# Patient Record
Sex: Male | Born: 1955 | Race: White | Hispanic: No | Marital: Married | Smoking: Former smoker
Health system: Southern US, Community
[De-identification: ages and names within clinical notes are randomized; demographics above are authoritative.]

## PROBLEM LIST (undated history)

## (undated) DIAGNOSIS — E079 Disorder of thyroid, unspecified: Secondary | ICD-10-CM

## (undated) DIAGNOSIS — K219 Gastro-esophageal reflux disease without esophagitis: Secondary | ICD-10-CM

## (undated) DIAGNOSIS — R0989 Other specified symptoms and signs involving the circulatory and respiratory systems: Secondary | ICD-10-CM

## (undated) DIAGNOSIS — I6529 Occlusion and stenosis of unspecified carotid artery: Secondary | ICD-10-CM

## (undated) DIAGNOSIS — K621 Rectal polyp: Secondary | ICD-10-CM

## (undated) HISTORY — DX: Gastro-esophageal reflux disease without esophagitis: K21.9

## (undated) HISTORY — DX: Disorder of thyroid, unspecified: E07.9

## (undated) HISTORY — PX: OTHER SURGICAL HISTORY: SHX169

## (undated) HISTORY — DX: Other specified symptoms and signs involving the circulatory and respiratory systems: R09.89

## (undated) HISTORY — DX: Rectal polyp: K62.1

## (undated) HISTORY — DX: Occlusion and stenosis of unspecified carotid artery: I65.29

## (undated) HISTORY — PX: TRANSANAL EXCISION OF RECTAL MASS: SHX6134

---

## 2015-02-03 HISTORY — PX: NECK SURGERY: SHX720

## 2016-02-03 HISTORY — PX: SHOULDER SURGERY: SHX246

## 2017-10-07 DIAGNOSIS — E039 Hypothyroidism, unspecified: Secondary | ICD-10-CM | POA: Diagnosis not present

## 2017-10-07 DIAGNOSIS — Z1212 Encounter for screening for malignant neoplasm of rectum: Secondary | ICD-10-CM | POA: Diagnosis not present

## 2017-10-07 DIAGNOSIS — Z0000 Encounter for general adult medical examination without abnormal findings: Secondary | ICD-10-CM | POA: Diagnosis not present

## 2017-10-07 DIAGNOSIS — Z6826 Body mass index (BMI) 26.0-26.9, adult: Secondary | ICD-10-CM | POA: Diagnosis not present

## 2017-11-19 DIAGNOSIS — E039 Hypothyroidism, unspecified: Secondary | ICD-10-CM | POA: Diagnosis not present

## 2018-02-07 DIAGNOSIS — E039 Hypothyroidism, unspecified: Secondary | ICD-10-CM | POA: Diagnosis not present

## 2018-04-19 DIAGNOSIS — L820 Inflamed seborrheic keratosis: Secondary | ICD-10-CM | POA: Diagnosis not present

## 2018-04-19 DIAGNOSIS — L570 Actinic keratosis: Secondary | ICD-10-CM | POA: Diagnosis not present

## 2018-04-19 DIAGNOSIS — D485 Neoplasm of uncertain behavior of skin: Secondary | ICD-10-CM | POA: Diagnosis not present

## 2018-04-19 DIAGNOSIS — Z23 Encounter for immunization: Secondary | ICD-10-CM | POA: Diagnosis not present

## 2018-04-19 DIAGNOSIS — L821 Other seborrheic keratosis: Secondary | ICD-10-CM | POA: Diagnosis not present

## 2018-07-21 DIAGNOSIS — L570 Actinic keratosis: Secondary | ICD-10-CM | POA: Diagnosis not present

## 2018-07-21 DIAGNOSIS — D1801 Hemangioma of skin and subcutaneous tissue: Secondary | ICD-10-CM | POA: Diagnosis not present

## 2018-07-21 DIAGNOSIS — L821 Other seborrheic keratosis: Secondary | ICD-10-CM | POA: Diagnosis not present

## 2018-07-21 DIAGNOSIS — L814 Other melanin hyperpigmentation: Secondary | ICD-10-CM | POA: Diagnosis not present

## 2018-08-11 DIAGNOSIS — E039 Hypothyroidism, unspecified: Secondary | ICD-10-CM | POA: Diagnosis not present

## 2018-08-11 DIAGNOSIS — Z6827 Body mass index (BMI) 27.0-27.9, adult: Secondary | ICD-10-CM | POA: Diagnosis not present

## 2018-08-11 DIAGNOSIS — R079 Chest pain, unspecified: Secondary | ICD-10-CM | POA: Diagnosis not present

## 2018-10-20 DIAGNOSIS — Z1331 Encounter for screening for depression: Secondary | ICD-10-CM | POA: Diagnosis not present

## 2018-10-20 DIAGNOSIS — Z6826 Body mass index (BMI) 26.0-26.9, adult: Secondary | ICD-10-CM | POA: Diagnosis not present

## 2018-10-20 DIAGNOSIS — Z0000 Encounter for general adult medical examination without abnormal findings: Secondary | ICD-10-CM | POA: Diagnosis not present

## 2018-10-28 ENCOUNTER — Encounter: Payer: Self-pay | Admitting: *Deleted

## 2018-10-28 DIAGNOSIS — R0989 Other specified symptoms and signs involving the circulatory and respiratory systems: Secondary | ICD-10-CM | POA: Insufficient documentation

## 2018-10-28 DIAGNOSIS — K219 Gastro-esophageal reflux disease without esophagitis: Secondary | ICD-10-CM

## 2018-10-28 DIAGNOSIS — E039 Hypothyroidism, unspecified: Secondary | ICD-10-CM

## 2018-11-01 ENCOUNTER — Other Ambulatory Visit: Payer: Self-pay

## 2018-11-01 ENCOUNTER — Ambulatory Visit (INDEPENDENT_AMBULATORY_CARE_PROVIDER_SITE_OTHER): Payer: BC Managed Care – PPO | Admitting: Cardiology

## 2018-11-01 ENCOUNTER — Encounter: Payer: Self-pay | Admitting: Cardiology

## 2018-11-01 VITALS — BP 120/80 | HR 52 | Ht 70.0 in | Wt 184.0 lb

## 2018-11-01 DIAGNOSIS — R0989 Other specified symptoms and signs involving the circulatory and respiratory systems: Secondary | ICD-10-CM

## 2018-11-01 DIAGNOSIS — R0789 Other chest pain: Secondary | ICD-10-CM | POA: Diagnosis not present

## 2018-11-01 DIAGNOSIS — R079 Chest pain, unspecified: Secondary | ICD-10-CM | POA: Diagnosis not present

## 2018-11-01 DIAGNOSIS — R001 Bradycardia, unspecified: Secondary | ICD-10-CM | POA: Diagnosis not present

## 2018-11-01 DIAGNOSIS — Z13220 Encounter for screening for lipoid disorders: Secondary | ICD-10-CM

## 2018-11-01 NOTE — Progress Notes (Signed)
Cardiology Office Note:    Date:  11/01/2018   ID:  Alroy Dust, DOB 1955/07/30, MRN [id number]  PCP:  Lowella Dandy, NP  Cardiologist:  Berniece Salines, DO  Electrophysiologist:  None   Referring MD: Lowella Dandy, NP   The patient was referred by his primary care provider for chest pain.  History of Present Illness:    Jason Schmidt is a 62 y.o. male with a hx of hypothyroidism, carotid bruits, obesity presents to be evaluated for chest pain.The patient describes the pain as a substernal burning sensation with pressure like feeling sometimes. He denies any radiation to this pain. He notes that the pain started months ago. He states that it is intermittent, last about a few minutes (10-82mns). He admits to associated fatigue but denies palpitations, shortness of breath, nausea or vomiting.    Past Medical History:  Diagnosis Date   Carotid artery occlusion    Carotid bruit    GERD (gastroesophageal reflux disease)    Rectal polyp    Thyroid disease     Past Surgical History:  Procedure Laterality Date   i&d abcess and hemorrhoidectomy     NECK SURGERY  2017   Metal in neck at work   SHOULDER SURGERY Left 2018   Nerve damage   TRANSANAL EXCISION OF RECTAL MASS      Current Medications: Current Meds  Medication Sig   levothyroxine (SYNTHROID) 100 MCG tablet Take 100 mcg by mouth daily before breakfast.     Allergies:   Codeine   Social History   Socioeconomic History   Marital status: Married    Spouse name: Not on file   Number of children: Not on file   Years of education: Not on file   Highest education level: Not on file  Occupational History   Not on file  Social Needs   Financial resource strain: Not on file   Food insecurity    Worry: Not on file    Inability: Not on file   Transportation needs    Medical: Not on file    Non-medical: Not on file  Tobacco Use   Smoking status: Former Smoker    Quit date: 2007    Years since  quitting: 13.7   Smokeless tobacco: Never Used  Substance and Sexual Activity   Alcohol use: Not Currently   Drug use: Never   Sexual activity: Not on file  Lifestyle   Physical activity    Days per week: Not on file    Minutes per session: Not on file   Stress: Not on file  Relationships   Social connections    Talks on phone: Not on file    Gets together: Not on file    Attends religious service: Not on file    Active member of club or organization: Not on file    Attends meetings of clubs or organizations: Not on file    Relationship status: Not on file  Other Topics Concern   Not on file  Social History Narrative   Not on file     Family History: The patient's family history includes Heart attack in his mother; Heart disease in his brother and father; Hyperlipidemia in his mother; Hypertension in his mother; Pancreatic cancer in his brother.  Father first MI was in his late 528s  Brother had his first MI in his early 666s ROS:   Review of Systems  Constitution: Negative for decreased appetite, fever and weight gain.  HENT: Negative for congestion, ear discharge, hoarse voice and sore throat.   Eyes: Negative for discharge, redness, vision loss in right eye and visual halos.  Cardiovascular: Reports chest pain. Negative for chest pain, dyspnea on exertion, leg swelling, orthopnea and palpitations.  Respiratory: Negative for cough, hemoptysis, shortness of breath and snoring.   Endocrine: Negative for heat intolerance and polyphagia.  Hematologic/Lymphatic: Negative for bleeding problem. Does not bruise/bleed easily.  Skin: Negative for flushing, nail changes, rash and suspicious lesions.  Musculoskeletal: Negative for arthritis, joint pain, muscle cramps, myalgias, neck pain and stiffness.  Gastrointestinal: Negative for abdominal pain, bowel incontinence, diarrhea and excessive appetite.  Genitourinary: Negative for decreased libido, genital sores and incomplete  emptying.  Neurological: Negative for brief paralysis, focal weakness, headaches and loss of balance.  Psychiatric/Behavioral: Negative for altered mental status, depression and suicidal ideas.  Allergic/Immunologic: Negative for HIV exposure and persistent infections.    EKGs/Labs/Other Studies Reviewed:    The following studies were reviewed today:   EKG:  The ekg ordered today demonstrates sinus bradycardia, heart rate 54 bpm  Records from pcp office reports a normal TTE in 2012  Recent Labs: Labs from July 2020 reports glucose 100, BUN 22, creatinine 1.0, sodium 138, potassium 4.5, chloride 101, bicarb 25, total bili 0.5, AST 18, ALT 16, alk phos 68, hemoglobin 15.1, hematocrit 44.1, platelets 191  Recent Lipid Panel Lipid profile unspecified date Total cholesterol 200, triglyceride 110, HDL 43, VLDL 20, LDL 137   Physical Exam:    VS:  BP 120/80 (BP Location: Left Arm, Patient Position: Sitting, Cuff Size: Normal)    Pulse (!) 52    Ht '5\' 10"'  (1.778 m)    Wt 184 lb (83.5 kg)    SpO2 96%    BMI 26.40 kg/m     Wt Readings from Last 3 Encounters:  11/01/18 184 lb (83.5 kg)     GEN: Well nourished, well developed in no acute distress HEENT: Normal NECK: No JVD; No carotid bruits LYMPHATICS: No lymphadenopathy CARDIAC: S1S2 noted,RRR, no murmurs, rubs, gallops RESPIRATORY:  Clear to auscultation without rales, wheezing or rhonchi  ABDOMEN: Soft, non-tender, non-distended, +bowel sounds, no guarding. EXTREMITIES: No edema, No cyanosis, no clubbing MUSCULOSKELETAL:  No edema; No deformity  SKIN: Warm and dry, skin discoloration  NEUROLOGIC:  Alert and oriented x 3, non-focal PSYCHIATRIC:  Normal affect, good insight  ASSESSMENT:    1. Carotid bruit, unspecified laterality   2. Chest pain of uncertain etiology   3. Sinus bradycardia   4. Chest pain, unspecified type   5. Screening cholesterol level    PLAN:    1.  I am concerned of his chest pain, although atypical  patient does have intermediate risk  for coronary artery disease.  Given this we will pursue up ischemic evaluation.  A pharmacologic nuclear stress test is appropriate at this time.  Patient was educated on the testing he has in agreement with getting the testing done.  2.  There is evidence of sinus bradycardia he is currently not on any AV nodal blockers.  Given his history of hypothyroidism we will get TSH today.  As well as a echocardiogram will be performed to assess RV/LV function as well as any structural abnormalities.  3.  Lipid profile will be performed today as well.  The patient is in agreement with the above plan. The patient left the office in stable condition.  The patient will follow up in 3 months.   Medication Adjustments/Labs and  Tests Ordered: Current medicines are reviewed at length with the patient today.  Concerns regarding medicines are outlined above.  Orders Placed This Encounter  Procedures   Basic Metabolic Panel (BMET)   Magnesium   TSH   Lipid Profile   MYOCARDIAL PERFUSION IMAGING   EKG 12-Lead   ECHOCARDIOGRAM COMPLETE   No orders of the defined types were placed in this encounter.   Patient Instructions  Medication Instructions:  Your physician recommends that you continue on your current medications as directed. Please refer to the Current Medication list given to you today.  If you need a refill on your cardiac medications before your next appointment, please call your pharmacy.   Lab work: Your physician recommends that you have a BMP, mag, TSH and lipid drawn today.  If you have labs (blood work) drawn today and your tests are completely normal, you will receive your results only by:  [facility] (if you have MyChart) OR  A paper copy in the mail If you have any lab test that is abnormal or we need to change your treatment, we will call you to review the results.  Testing/Procedures: You had an EKG performed today.  Your  physician has requested that you have a lexiscan myoview. For further information please visit [email or web address]. Please follow instruction sheet, as given.  Your physician has requested that you have an echocardiogram. Echocardiography is a painless test that uses sound waves to create images of your heart. It provides your doctor with information about the size and shape of your heart and how well your hearts chambers and valves are working. This procedure takes approximately one hour. There are no restrictions for this procedure.   Follow-Up: At [facility], you and your health needs are our priority.  As part of our continuing mission to provide you with exceptional heart care, we have created designated Provider Care Teams.  These Care Teams include your primary Cardiologist (physician) and Advanced Practice Providers (APPs -  Physician Assistants and Nurse Practitioners) who all work together to provide you with the care you need, when you need it.  You will need a follow up appointment in 3 months.    Kardi Zaina Jenkin, DO  Any Other Special Instructions Will Be Listed Below  Regadenoson injection What is this medicine? REGADENOSON is used to test the heart for coronary artery disease. It is used in patients who can not exercise for their stress test. This medicine may be used for other purposes; ask your health care provider or pharmacist if you have questions. COMMON BRAND NAME(S): Lexiscan What should I tell my health care provider before I take this medicine? They need to know if you have any of these conditions:  heart problems  lung or breathing disease, like asthma or COPD  an unusual or allergic reaction to regadenoson, other medicines, foods, dyes, or preservatives  pregnant or trying to get pregnant  breast-feeding How should I use this medicine? This medicine is for injection into a vein. It is given by a health care professional in a hospital or clinic setting. Talk  to your pediatrician regarding the use of this medicine in children. Special care may be needed. Overdosage: If you think you have taken too much of this medicine contact a poison control center or emergency room at once. NOTE: This medicine is only for you. Do not share this medicine with others. What if I miss a dose? This does not apply. What may interact with this medicine?  caffeine  dipyridamole  guarana  theophylline This list may not describe all possible interactions. Give your health care provider a list of all the medicines, herbs, non-prescription drugs, or dietary supplements you use. Also tell them if you smoke, drink alcohol, or use illegal drugs. Some items may interact with your medicine. What should I watch for while using this medicine? Your condition will be monitored carefully while you are receiving this medicine. Do not take medicines, foods, or drinks with caffeine (like coffee, tea, or colas) for at least 12 hours before your test. If you do not know if something contains caffeine, ask your health care professional. What side effects may I notice from receiving this medicine? Side effects that you should report to your doctor or health care professional as soon as possible:  allergic reactions like skin rash, itching or hives, swelling of the face, lips, or tongue  breathing problems  chest pain, tightness or palpitations  severe headache Side effects that usually do not require medical attention (report to your doctor or health care professional if they continue or are bothersome):  flushing  headache  irritation or pain at site where injected  nausea, vomiting This list may not describe all possible side effects. Call your doctor for medical advice about side effects. You may report side effects to FDA at 1-800-FDA-1088. Where should I keep my medicine? This drug is given in a hospital or clinic and will not be stored at home. NOTE: This sheet is a  summary. It may not cover all possible information. If you have questions about this medicine, talk to your doctor, pharmacist, or health care provider.  2020 Elsevier/Gold Standard (2007-09-19 15:08:13)   Cardiac Nuclear Scan A cardiac nuclear scan is a test that is done to check the flow of blood to your heart. It is done when you are resting and when you are exercising. The test looks for problems such as:  Not enough blood reaching a portion of the heart.  The heart muscle not working as it should. You may need this test if:  You have heart disease.  You have had lab results that are not normal.  You have had heart surgery or a balloon procedure to open up blocked arteries (angioplasty).  You have chest pain.  You have shortness of breath. In this test, a special dye (tracer) is put into your bloodstream. The tracer will travel to your heart. A camera will then take pictures of your heart to see how the tracer moves through your heart. This test is usually done at a hospital and takes 2-4 hours. Tell a doctor about:  Any allergies you have.  All medicines you are taking, including vitamins, herbs, eye drops, creams, and over-the-counter medicines.  Any problems you or family members have had with anesthetic medicines.  Any blood disorders you have.  Any surgeries you have had.  Any medical conditions you have.  Whether you are pregnant or may be pregnant. What are the risks? Generally, this is a safe test. However, problems may occur, such as:  Serious chest pain and heart attack. This is only a risk if the stress portion of the test is done.  Rapid heartbeat.  A feeling of warmth in your chest. This feeling usually does not last long.  Allergic reaction to the tracer. What happens before the test?  Ask your doctor about changing or stopping your normal medicines. This is important.  Follow instructions from your doctor about what you cannot  eat or  drink.  Remove your jewelry on the day of the test. What happens during the test?  An IV tube will be inserted into one of your veins.  Your doctor will give you a small amount of tracer through the IV tube.  You will wait for 20-40 minutes while the tracer moves through your bloodstream.  Your heart will be monitored with an electrocardiogram (ECG).  You will lie down on an exam table.  Pictures of your heart will be taken for about 15-20 minutes.  You may also have a stress test. For this test, one of these things may be done: ? You will be asked to exercise on a treadmill or a stationary bike. ? You will be given medicines that will make your heart work harder. This is done if you are unable to exercise.  When blood flow to your heart has peaked, a tracer will again be given through the IV tube.  After 20-40 minutes, you will get back on the exam table. More pictures will be taken of your heart.  Depending on the tracer that is used, more pictures may need to be taken 3-4 hours later.  Your IV tube will be removed when the test is over. The test may vary among doctors and hospitals. What happens after the test?  Ask your doctor: ? Whether you can return to your normal schedule, including diet, activities, and medicines. ? Whether you should drink more fluids. This will help to remove the tracer from your body. Drink enough fluid to keep your pee (urine) pale yellow.  Ask your doctor, or the department that is doing the test: ? When will my results be ready? ? How will I get my results? Summary  A cardiac nuclear scan is a test that is done to check the flow of blood to your heart.  Tell your doctor whether you are pregnant or may be pregnant.  Before the test, ask your doctor about changing or stopping your normal medicines. This is important.  Ask your doctor whether you can return to your normal activities. You may be asked to drink more fluids. This information is  not intended to replace advice given to you by your health care provider. Make sure you discuss any questions you have with your health care provider. Document Released: 07/05/2017 Document Revised: 05/11/2018 Document Reviewed: 07/05/2017 Elsevier Patient Education  [employer].  Echocardiogram An echocardiogram is a procedure that uses painless sound waves (ultrasound) to produce an image of the heart. Images from an echocardiogram can provide important information about:  Signs of coronary artery disease (CAD).  Aneurysm detection. An aneurysm is a weak or damaged part of an artery wall that bulges out from the normal force of blood pumping through the body.  Heart size and shape. Changes in the size or shape of the heart can be associated with certain conditions, including heart failure, aneurysm, and CAD.  Heart muscle function.  Heart valve function.  Signs of a past heart attack.  Fluid buildup around the heart.  Thickening of the heart muscle.  A tumor or infectious growth around the heart valves. Tell a health care provider about:  Any allergies you have.  All medicines you are taking, including vitamins, herbs, eye drops, creams, and over-the-counter medicines.  Any blood disorders you have.  Any surgeries you have had.  Any medical conditions you have.  Whether you are pregnant or may be pregnant. What are the risks? Generally, this  is a safe procedure. However, problems may occur, including:  Allergic reaction to dye (contrast) that may be used during the procedure. What happens before the procedure? No specific preparation is needed. You may eat and drink normally. What happens during the procedure?   An IV tube may be inserted into one of your veins.  You may receive contrast through this tube. A contrast is an injection that improves the quality of the pictures from your heart.  A gel will be applied to your chest.  A wand-like tool  (transducer) will be moved over your chest. The gel will help to transmit the sound waves from the transducer.  The sound waves will harmlessly bounce off of your heart to allow the heart images to be captured in real-time motion. The images will be recorded on a computer. The procedure may vary among health care providers and hospitals. What happens after the procedure?  You may return to your normal, everyday life, including diet, activities, and medicines, unless your health care provider tells you not to do that. Summary  An echocardiogram is a procedure that uses painless sound waves (ultrasound) to produce an image of the heart.  Images from an echocardiogram can provide important information about the size and shape of your heart, heart muscle function, heart valve function, and fluid buildup around your heart.  You do not need to do anything to prepare before this procedure. You may eat and drink normally.  After the echocardiogram is completed, you may return to your normal, everyday life, unless your health care provider tells you not to do that. This information is not intended to replace advice given to you by your health care provider. Make sure you discuss any questions you have with your health care provider. Document Released: 01/17/2000 Document Revised: 05/12/2018 Document Reviewed: 02/22/2016 Elsevier Patient Education  2020 [employer].     Adopting a Healthy Lifestyle.  Know what a healthy weight is for you (roughly BMI <25) and aim to maintain this   Aim for 7+ servings of fruits and vegetables daily   65-80+ fluid ounces of water or unsweet tea for healthy kidneys   Limit to max 1 drink of alcohol per day; avoid smoking/tobacco   Limit animal fats in diet for cholesterol and heart health - choose grass fed whenever available   Avoid highly processed foods, and foods high in saturated/trans fats   Aim for low stress - take time to unwind and care for your  mental health   Aim for 150 min of moderate intensity exercise weekly for heart health, and weights twice weekly for bone health   Aim for 7-9 hours of sleep daily   When it comes to diets, agreement about the perfect plan isnt easy to find, even among the experts. Experts at the [employer] developed an idea known as the Healthy Eating Plate. Just imagine a plate divided into logical, healthy portions.   The emphasis is on diet quality:   Load up on vegetables and fruits - one-half of your plate: Aim for color and variety, and remember that potatoes dont count.   Go for whole grains - one-quarter of your plate: Whole wheat, barley, wheat berries, quinoa, oats, brown rice, and foods made with them. If you want pasta, go with whole wheat pasta.   Protein power - one-quarter of your plate: Fish, chicken, beans, and nuts are all healthy, versatile protein sources. Limit red meat.   The diet, however,  does go beyond the plate, offering a few other suggestions.   Use healthy plant oils, such as olive, canola, soy, corn, sunflower and peanut. Check the labels, and avoid partially hydrogenated oil, which have unhealthy trans fats.   If youre thirsty, drink water. Coffee and tea are good in moderation, but skip sugary drinks and limit milk and dairy products to one or two daily servings.   The type of carbohydrate in the diet is more important than the amount. Some sources of carbohydrates, such as vegetables, fruits, whole grains, and beans-are healthier than others.   Finally, stay active  Signed, Berniece Salines, DO  11/01/2018 8:53 AM    [place]

## 2018-11-01 NOTE — Patient Instructions (Addendum)
Medication Instructions:  Your physician recommends that you continue on your current medications as directed. Please refer to the Current Medication list given to you today.  If you need a refill on your cardiac medications before your next appointment, please call your pharmacy.   Lab work: Your physician recommends that you have a BMP, mag, TSH and lipid drawn today.  If you have labs (blood work) drawn today and your tests are completely normal, you will receive your results only by: Marland Kitchen MyChart Message (if you have MyChart) OR . A paper copy in the mail If you have any lab test that is abnormal or we need to change your treatment, we will call you to review the results.  Testing/Procedures: You had an EKG performed today.  Your physician has requested that you have a lexiscan myoview. For further information please visit [email or web address] Please follow instruction sheet, as given.  Your physician has requested that you have an echocardiogram. Echocardiography is a painless test that uses sound waves to create images of your heart. It provides your doctor with information about the size and shape of your heart and how well your heart's chambers and valves are working. This procedure takes approximately one hour. There are no restrictions for this procedure.   Follow-Up: At [facility], you and your health needs are our priority.  As part of our continuing mission to provide you with exceptional heart care, we have created designated Provider Care Teams.  These Care Teams include your primary Cardiologist (physician) and Advanced Practice Providers (APPs -  Physician Assistants and Nurse Practitioners) who all work together to provide you with the care you need, when you need it. . You will need a follow up appointment in 3 months.   Jolyn Lent Tobb, DO  Any Other Special Instructions Will Be Listed Below  Regadenoson injection What is this medicine? REGADENOSON is used to test the  heart for coronary artery disease. It is used in patients who can not exercise for their stress test. This medicine may be used for other purposes; ask your health care provider or pharmacist if you have questions. COMMON BRAND NAME(S): Lexiscan What should I tell my health care provider before I take this medicine? They need to know if you have any of these conditions:  heart problems  lung or breathing disease, like asthma or COPD  an unusual or allergic reaction to regadenoson, other medicines, foods, dyes, or preservatives  pregnant or trying to get pregnant  breast-feeding How should I use this medicine? This medicine is for injection into a vein. It is given by a health care professional in a hospital or clinic setting. Talk to your pediatrician regarding the use of this medicine in children. Special care may be needed. Overdosage: If you think you have taken too much of this medicine contact a poison control center or emergency room at once. NOTE: This medicine is only for you. Do not share this medicine with others. What if I miss a dose? This does not apply. What may interact with this medicine?  caffeine  dipyridamole  guarana  theophylline This list may not describe all possible interactions. Give your health care provider a list of all the medicines, herbs, non-prescription drugs, or dietary supplements you use. Also tell them if you smoke, drink alcohol, or use illegal drugs. Some items may interact with your medicine. What should I watch for while using this medicine? Your condition will be monitored carefully while you are receiving this medicine.  Do not take medicines, foods, or drinks with caffeine (like coffee, tea, or colas) for at least 12 hours before your test. If you do not know if something contains caffeine, ask your health care professional. What side effects may I notice from receiving this medicine? Side effects that you should report to your doctor or  health care professional as soon as possible:  allergic reactions like skin rash, itching or hives, swelling of the face, lips, or tongue  breathing problems  chest pain, tightness or palpitations  severe headache Side effects that usually do not require medical attention (report to your doctor or health care professional if they continue or are bothersome):  flushing  headache  irritation or pain at site where injected  nausea, vomiting This list may not describe all possible side effects. Call your doctor for medical advice about side effects. You may report side effects to FDA at 1-800-FDA-1088. Where should I keep my medicine? This drug is given in a hospital or clinic and will not be stored at home. NOTE: This sheet is a summary. It may not cover all possible information. If you have questions about this medicine, talk to your doctor, pharmacist, or health care provider.  2020 Elsevier/Gold Standard (2007-09-19 15:08:13)   Cardiac Nuclear Scan A cardiac nuclear scan is a test that is done to check the flow of blood to your heart. It is done when you are resting and when you are exercising. The test looks for problems such as:  Not enough blood reaching a portion of the heart.  The heart muscle not working as it should. You may need this test if:  You have heart disease.  You have had lab results that are not normal.  You have had heart surgery or a balloon procedure to open up blocked arteries (angioplasty).  You have chest pain.  You have shortness of breath. In this test, a special dye (tracer) is put into your bloodstream. The tracer will travel to your heart. A camera will then take pictures of your heart to see how the tracer moves through your heart. This test is usually done at a hospital and takes 2-4 hours. Tell a doctor about:  Any allergies you have.  All medicines you are taking, including vitamins, herbs, eye drops, creams, and over-the-counter  medicines.  Any problems you or family members have had with anesthetic medicines.  Any blood disorders you have.  Any surgeries you have had.  Any medical conditions you have.  Whether you are pregnant or may be pregnant. What are the risks? Generally, this is a safe test. However, problems may occur, such as:  Serious chest pain and heart attack. This is only a risk if the stress portion of the test is done.  Rapid heartbeat.  A feeling of warmth in your chest. This feeling usually does not last long.  Allergic reaction to the tracer. What happens before the test?  Ask your doctor about changing or stopping your normal medicines. This is important.  Follow instructions from your doctor about what you cannot eat or drink.  Remove your jewelry on the day of the test. What happens during the test?  An IV tube will be inserted into one of your veins.  Your doctor will give you a small amount of tracer through the IV tube.  You will wait for 20-40 minutes while the tracer moves through your bloodstream.  Your heart will be monitored with an electrocardiogram (ECG).  You will lie  down on an exam table.  Pictures of your heart will be taken for about 15-20 minutes.  You may also have a stress test. For this test, one of these things may be done: ? You will be asked to exercise on a treadmill or a stationary bike. ? You will be given medicines that will make your heart work harder. This is done if you are unable to exercise.  When blood flow to your heart has peaked, a tracer will again be given through the IV tube.  After 20-40 minutes, you will get back on the exam table. More pictures will be taken of your heart.  Depending on the tracer that is used, more pictures may need to be taken 3-4 hours later.  Your IV tube will be removed when the test is over. The test may vary among doctors and hospitals. What happens after the test?  Ask your doctor: ? Whether you  can return to your normal schedule, including diet, activities, and medicines. ? Whether you should drink more fluids. This will help to remove the tracer from your body. Drink enough fluid to keep your pee (urine) pale yellow.  Ask your doctor, or the department that is doing the test: ? When will my results be ready? ? How will I get my results? Summary  A cardiac nuclear scan is a test that is done to check the flow of blood to your heart.  Tell your doctor whether you are pregnant or may be pregnant.  Before the test, ask your doctor about changing or stopping your normal medicines. This is important.  Ask your doctor whether you can return to your normal activities. You may be asked to drink more fluids. This information is not intended to replace advice given to you by your health care provider. Make sure you discuss any questions you have with your health care provider. Document Released: 07/05/2017 Document Revised: 05/11/2018 Document Reviewed: 07/05/2017 Elsevier Patient Education  2020 [employer]  Echocardiogram An echocardiogram is a procedure that uses painless sound waves (ultrasound) to produce an image of the heart. Images from an echocardiogram can provide important information about:  Signs of coronary artery disease (CAD).  Aneurysm detection. An aneurysm is a weak or damaged part of an artery wall that bulges out from the normal force of blood pumping through the body.  Heart size and shape. Changes in the size or shape of the heart can be associated with certain conditions, including heart failure, aneurysm, and CAD.  Heart muscle function.  Heart valve function.  Signs of a past heart attack.  Fluid buildup around the heart.  Thickening of the heart muscle.  A tumor or infectious growth around the heart valves. Tell a health care provider about:  Any allergies you have.  All medicines you are taking, including vitamins, herbs, eye drops, creams,  and over-the-counter medicines.  Any blood disorders you have.  Any surgeries you have had.  Any medical conditions you have.  Whether you are pregnant or may be pregnant. What are the risks? Generally, this is a safe procedure. However, problems may occur, including:  Allergic reaction to dye (contrast) that may be used during the procedure. What happens before the procedure? No specific preparation is needed. You may eat and drink normally. What happens during the procedure?   An IV tube may be inserted into one of your veins.  You may receive contrast through this tube. A contrast is an injection that improves the quality  of the pictures from your heart.  A gel will be applied to your chest.  A wand-like tool (transducer) will be moved over your chest. The gel will help to transmit the sound waves from the transducer.  The sound waves will harmlessly bounce off of your heart to allow the heart images to be captured in real-time motion. The images will be recorded on a computer. The procedure may vary among health care providers and hospitals. What happens after the procedure?  You may return to your normal, everyday life, including diet, activities, and medicines, unless your health care provider tells you not to do that. Summary  An echocardiogram is a procedure that uses painless sound waves (ultrasound) to produce an image of the heart.  Images from an echocardiogram can provide important information about the size and shape of your heart, heart muscle function, heart valve function, and fluid buildup around your heart.  You do not need to do anything to prepare before this procedure. You may eat and drink normally.  After the echocardiogram is completed, you may return to your normal, everyday life, unless your health care provider tells you not to do that. This information is not intended to replace advice given to you by your health care provider. Make sure you  discuss any questions you have with your health care provider. Document Released: 01/17/2000 Document Revised: 05/12/2018 Document Reviewed: 02/22/2016 Elsevier Patient Education  2020 [employer].

## 2018-11-02 ENCOUNTER — Telehealth: Payer: Self-pay

## 2018-11-02 LAB — BASIC METABOLIC PANEL
BUN/Creatinine Ratio: 17 (ref 10–24)
BUN: 17 mg/dL (ref 8–27)
CO2: 24 mmol/L (ref 20–29)
Calcium: 9.5 mg/dL (ref 8.6–10.2)
Chloride: 102 mmol/L (ref 96–106)
Creatinine, Ser: 0.99 mg/dL (ref 0.76–1.27)
GFR calc Af Amer: 94 mL/min/{1.73_m2} (ref >59)
GFR calc non Af Amer: 81 mL/min/{1.73_m2} (ref >59)
Glucose: 116 mg/dL — ABNORMAL HIGH (ref 65–99)
Potassium: 4.1 mmol/L (ref 3.5–5.2)
Sodium: 139 mmol/L (ref 134–144)

## 2018-11-02 LAB — LIPID PANEL
Chol/HDL Ratio: 4.1 ratio (ref 0.0–5.0)
Cholesterol, Total: 170 mg/dL (ref 100–199)
HDL: 41 mg/dL (ref >39)
LDL Chol Calc (NIH): 110 mg/dL — ABNORMAL HIGH (ref 0–99)
Triglycerides: 104 mg/dL (ref 0–149)
VLDL Cholesterol Cal: 19 mg/dL (ref 5–40)

## 2018-11-02 LAB — MAGNESIUM: Magnesium: 2 mg/dL (ref 1.6–2.3)

## 2018-11-02 LAB — TSH: TSH: 2.01 u[IU]/mL (ref 0.450–4.500)

## 2018-11-02 NOTE — Telephone Encounter (Signed)
Information relayed, copy of results sent to Dr. Manson Allan.

## 2018-11-02 NOTE — Telephone Encounter (Signed)
-----   Message from Berniece Salines, DO sent at 11/02/2018  8:23 AM EDT ----- normal echo please notify patient. FU as planned.

## 2018-11-04 ENCOUNTER — Ambulatory Visit (HOSPITAL_BASED_OUTPATIENT_CLINIC_OR_DEPARTMENT_OTHER)
Admission: RE | Admit: 2018-11-04 | Discharge: 2018-11-04 | Disposition: A | Discharge status: Home / Self Care | Payer: BC Managed Care – PPO | Source: Ambulatory Visit | Attending: Cardiology | Admitting: Cardiology

## 2018-11-04 ENCOUNTER — Other Ambulatory Visit: Payer: Self-pay

## 2018-11-04 DIAGNOSIS — R079 Chest pain, unspecified: Secondary | ICD-10-CM | POA: Insufficient documentation

## 2018-11-04 DIAGNOSIS — R001 Bradycardia, unspecified: Secondary | ICD-10-CM | POA: Insufficient documentation

## 2018-11-04 NOTE — Progress Notes (Signed)
  Echocardiogram 2D Echocardiogram has been performed.  Jason Schmidt 11/04/2018, 2:54 PM

## 2018-11-07 ENCOUNTER — Encounter: Payer: Self-pay | Admitting: *Deleted

## 2018-11-09 ENCOUNTER — Telehealth (HOSPITAL_COMMUNITY): Payer: Self-pay | Admitting: *Deleted

## 2018-11-09 NOTE — Telephone Encounter (Signed)
Left message on voicemail per DPR in reference to upcoming appointment scheduled on 11/17/18 with detailed instructions given per Myocardial Perfusion Study Information Sheet for the test. LM to arrive 15 minutes early, and that it is imperative to arrive on time for appointment to keep from having the test rescheduled. If you need to cancel or reschedule your appointment, please call the office within 24 hours of your appointment. Failure to do so may result in a cancellation of your appointment, and a $50 no show fee. Phone number given for call back for any questions. Ivah Girardot Jacqueline   

## 2018-11-17 ENCOUNTER — Other Ambulatory Visit: Payer: Self-pay

## 2018-11-17 ENCOUNTER — Ambulatory Visit (INDEPENDENT_AMBULATORY_CARE_PROVIDER_SITE_OTHER): Payer: BC Managed Care – PPO

## 2018-11-17 DIAGNOSIS — R079 Chest pain, unspecified: Secondary | ICD-10-CM | POA: Diagnosis not present

## 2018-11-17 LAB — MYOCARDIAL PERFUSION IMAGING
LV dias vol: 71 mL (ref 62–150)
LV sys vol: 30 mL
Peak HR: 93 {beats}/min
Rest HR: 50 {beats}/min
SDS: 1
SRS: 0
SSS: 1
TID: 1.11

## 2018-11-17 MED ORDER — TECHNETIUM TC 99M TETROFOSMIN IV KIT
31.3000 | PACK | Freq: Once | INTRAVENOUS | Status: AC | PRN
  Administered 2018-11-17: 31.3 via INTRAVENOUS

## 2018-11-17 MED ORDER — TECHNETIUM TC 99M TETROFOSMIN IV KIT
11.0000 | PACK | Freq: Once | INTRAVENOUS | Status: AC | PRN
  Administered 2018-11-17: 11 via INTRAVENOUS

## 2018-11-17 MED ORDER — REGADENOSON 0.4 MG/5ML IV SOLN
0.4000 mg | Freq: Once | INTRAVENOUS | Status: AC
  Administered 2018-11-17: 0.4 mg via INTRAVENOUS

## 2019-01-19 DIAGNOSIS — R6889 Other general symptoms and signs: Secondary | ICD-10-CM | POA: Diagnosis not present

## 2019-01-19 DIAGNOSIS — Z20828 Contact with and (suspected) exposure to other viral communicable diseases: Secondary | ICD-10-CM | POA: Diagnosis not present

## 2019-01-31 ENCOUNTER — Ambulatory Visit: Payer: BC Managed Care – PPO | Admitting: Cardiology

## 2019-02-20 ENCOUNTER — Ambulatory Visit: Payer: BC Managed Care – PPO | Admitting: Cardiology

## 2021-02-07 ENCOUNTER — Other Ambulatory Visit: Payer: Self-pay | Admitting: Adult Health Nurse Practitioner

## 2021-02-07 DIAGNOSIS — R519 Headache, unspecified: Secondary | ICD-10-CM

## 2021-02-12 ENCOUNTER — Other Ambulatory Visit: Payer: Self-pay | Admitting: Adult Health Nurse Practitioner

## 2021-02-12 DIAGNOSIS — I671 Cerebral aneurysm, nonruptured: Secondary | ICD-10-CM

## 2021-02-12 DIAGNOSIS — R519 Headache, unspecified: Secondary | ICD-10-CM

## 2021-02-20 ENCOUNTER — Other Ambulatory Visit: Payer: Self-pay

## 2021-02-20 ENCOUNTER — Ambulatory Visit
Admission: RE | Admit: 2021-02-20 | Discharge: 2021-02-20 | Disposition: A | Discharge status: Home / Self Care | Payer: Medicare Other | Source: Ambulatory Visit | Attending: Adult Health Nurse Practitioner | Admitting: Adult Health Nurse Practitioner

## 2021-02-20 DIAGNOSIS — I671 Cerebral aneurysm, nonruptured: Secondary | ICD-10-CM

## 2021-02-20 DIAGNOSIS — R519 Headache, unspecified: Secondary | ICD-10-CM

## 2021-02-20 MED ORDER — IOPAMIDOL (ISOVUE-370) INJECTION 76%
75.0000 mL | Freq: Once | INTRAVENOUS | Status: AC | PRN
  Administered 2021-02-20: 75 mL via INTRAVENOUS

## 2021-02-26 DIAGNOSIS — I671 Cerebral aneurysm, nonruptured: Secondary | ICD-10-CM | POA: Diagnosis not present

## 2021-02-26 DIAGNOSIS — Z8679 Personal history of other diseases of the circulatory system: Secondary | ICD-10-CM | POA: Diagnosis not present

## 2021-02-26 DIAGNOSIS — Z9889 Other specified postprocedural states: Secondary | ICD-10-CM | POA: Diagnosis not present

## 2021-04-08 DIAGNOSIS — J302 Other seasonal allergic rhinitis: Secondary | ICD-10-CM | POA: Diagnosis not present

## 2021-05-02 DIAGNOSIS — Z9889 Other specified postprocedural states: Secondary | ICD-10-CM | POA: Diagnosis not present

## 2021-05-02 DIAGNOSIS — Z8679 Personal history of other diseases of the circulatory system: Secondary | ICD-10-CM | POA: Diagnosis not present

## 2021-05-02 DIAGNOSIS — R7301 Impaired fasting glucose: Secondary | ICD-10-CM | POA: Diagnosis not present

## 2021-05-02 DIAGNOSIS — E785 Hyperlipidemia, unspecified: Secondary | ICD-10-CM | POA: Diagnosis not present

## 2021-05-02 DIAGNOSIS — Z9181 History of falling: Secondary | ICD-10-CM | POA: Diagnosis not present

## 2021-05-02 DIAGNOSIS — R7303 Prediabetes: Secondary | ICD-10-CM | POA: Diagnosis not present

## 2021-05-02 DIAGNOSIS — Z1211 Encounter for screening for malignant neoplasm of colon: Secondary | ICD-10-CM | POA: Diagnosis not present

## 2021-05-02 DIAGNOSIS — J302 Other seasonal allergic rhinitis: Secondary | ICD-10-CM | POA: Diagnosis not present

## 2021-05-02 DIAGNOSIS — E039 Hypothyroidism, unspecified: Secondary | ICD-10-CM | POA: Diagnosis not present

## 2021-05-02 DIAGNOSIS — G4700 Insomnia, unspecified: Secondary | ICD-10-CM | POA: Diagnosis not present

## 2021-06-12 DIAGNOSIS — Z7982 Long term (current) use of aspirin: Secondary | ICD-10-CM | POA: Diagnosis not present

## 2021-06-12 DIAGNOSIS — Z87891 Personal history of nicotine dependence: Secondary | ICD-10-CM | POA: Diagnosis not present

## 2021-06-12 DIAGNOSIS — Z48812 Encounter for surgical aftercare following surgery on the circulatory system: Secondary | ICD-10-CM | POA: Diagnosis not present

## 2021-06-12 DIAGNOSIS — Z8679 Personal history of other diseases of the circulatory system: Secondary | ICD-10-CM | POA: Diagnosis not present

## 2021-06-12 DIAGNOSIS — I671 Cerebral aneurysm, nonruptured: Secondary | ICD-10-CM | POA: Diagnosis not present

## 2021-07-01 DIAGNOSIS — J209 Acute bronchitis, unspecified: Secondary | ICD-10-CM | POA: Diagnosis not present

## 2021-07-01 DIAGNOSIS — K121 Other forms of stomatitis: Secondary | ICD-10-CM | POA: Diagnosis not present

## 2021-09-29 DIAGNOSIS — S01331A Puncture wound without foreign body of right ear, initial encounter: Secondary | ICD-10-CM | POA: Diagnosis not present

## 2021-09-29 DIAGNOSIS — Z23 Encounter for immunization: Secondary | ICD-10-CM | POA: Diagnosis not present

## 2021-09-29 DIAGNOSIS — H9201 Otalgia, right ear: Secondary | ICD-10-CM | POA: Diagnosis not present

## 2021-11-03 DIAGNOSIS — Z9181 History of falling: Secondary | ICD-10-CM | POA: Diagnosis not present

## 2021-11-03 DIAGNOSIS — E039 Hypothyroidism, unspecified: Secondary | ICD-10-CM | POA: Diagnosis not present

## 2021-11-03 DIAGNOSIS — J302 Other seasonal allergic rhinitis: Secondary | ICD-10-CM | POA: Diagnosis not present

## 2021-11-03 DIAGNOSIS — Z8679 Personal history of other diseases of the circulatory system: Secondary | ICD-10-CM | POA: Diagnosis not present

## 2021-11-03 DIAGNOSIS — G4700 Insomnia, unspecified: Secondary | ICD-10-CM | POA: Diagnosis not present

## 2021-11-03 DIAGNOSIS — R7303 Prediabetes: Secondary | ICD-10-CM | POA: Diagnosis not present

## 2021-11-03 DIAGNOSIS — E785 Hyperlipidemia, unspecified: Secondary | ICD-10-CM | POA: Diagnosis not present

## 2021-11-18 DIAGNOSIS — J069 Acute upper respiratory infection, unspecified: Secondary | ICD-10-CM | POA: Diagnosis not present

## 2021-11-18 DIAGNOSIS — U071 COVID-19: Secondary | ICD-10-CM | POA: Diagnosis not present

## 2021-11-18 DIAGNOSIS — K121 Other forms of stomatitis: Secondary | ICD-10-CM | POA: Diagnosis not present

## 2021-12-15 DIAGNOSIS — Z8679 Personal history of other diseases of the circulatory system: Secondary | ICD-10-CM | POA: Diagnosis not present

## 2021-12-15 DIAGNOSIS — Z9889 Other specified postprocedural states: Secondary | ICD-10-CM | POA: Diagnosis not present

## 2022-03-10 ENCOUNTER — Ambulatory Visit: Payer: Medicare Other | Admitting: Podiatry

## 2022-03-10 DIAGNOSIS — M205X1 Other deformities of toe(s) (acquired), right foot: Secondary | ICD-10-CM | POA: Diagnosis not present

## 2022-03-10 DIAGNOSIS — L84 Corns and callosities: Secondary | ICD-10-CM

## 2022-03-10 MED ORDER — UREA 10 % EX CREA: TOPICAL_CREAM | CUTANEOUS | 0 refills | Status: AC | PRN

## 2022-03-10 NOTE — Progress Notes (Signed)
  Subjective:  Patient ID: Jason Schmidt, male    DOB: [birth year],  MRN: [id number]  Chief Complaint  Patient presents with   Callouses    Right corn on side of pinky toe, not going away    67 y.o. male presents with concern for painful corn on the side of the right fifth toe.  He says he does not wearing a long time he has tried some lotion in the past but is not using any right now.  Says it would not go away and causes him significant pain with any pressure on the area when walking.  Past Medical History:  Diagnosis Date   Carotid artery occlusion    Carotid bruit    GERD (gastroesophageal reflux disease)    Rectal polyp    Thyroid disease     Allergies  Allergen Reactions   Codeine     ROS: Negative except as per HPI above  Objective:  General: AAO x3, NAD  Dermatological: Hyperkeratotic lesion at the plantar lateral aspect of the right fifth toe distal phalanx.  No underlying ulceration but significant pain with pressure on this area.  Vascular:  Dorsalis Pedis artery and Posterior Tibial artery pedal pulses are 2/4 bilateral.  Capillary fill time < 3 sec to all digits.   Neruologic: Grossly intact via light touch bilateral. Protective threshold intact to all sites bilateral.   Musculoskeletal: Adductovarus rotation of the toe on the right foot.  Pain on palpation of the  Gait: Unassisted, Nonantalgic.   No images are attached to the encounter.  Radiographs:  Deferred Assessment:   1. Corn of toe   2. Adductovarus rotation of toe, acquired, right      Plan:  Patient was evaluated and treated and all questions answered.  #Painful hyperkeratotic lesion pressure corn at the plantar lateral aspect of the right 5th toe distal phalanx # Adductovarus rotation fifth toe All symptomatic hyperkeratoses were safely debrided with a sterile #15 blade to patient's level of comfort without incident. We discussed preventative and palliative care of these lesions including  supportive and accommodative shoegear, padding, prefabricated and custom molded accommodative orthoses, use of a pumice stone and lotions/creams daily. -Will proceed with urea 10% ointment applied to the area as needed to prevent buildup of the lesion. -Also recommend gel toe cap for the right fifth toe.  This was dispensed to the patient. -Discussed that surgical intervention would be possible in the future including derotational arthroplasty of the fifth toe on the right foot should conservative measures fail.  Return in about 10 weeks (around 05/19/2022) for Callus care R 5th toe.          Everitt Amber, DPM [facility]

## 2022-05-14 DIAGNOSIS — G4700 Insomnia, unspecified: Secondary | ICD-10-CM | POA: Diagnosis not present

## 2022-05-14 DIAGNOSIS — Z1212 Encounter for screening for malignant neoplasm of rectum: Secondary | ICD-10-CM | POA: Diagnosis not present

## 2022-05-14 DIAGNOSIS — Z139 Encounter for screening, unspecified: Secondary | ICD-10-CM | POA: Diagnosis not present

## 2022-05-14 DIAGNOSIS — J302 Other seasonal allergic rhinitis: Secondary | ICD-10-CM | POA: Diagnosis not present

## 2022-05-14 DIAGNOSIS — E039 Hypothyroidism, unspecified: Secondary | ICD-10-CM | POA: Diagnosis not present

## 2022-05-14 DIAGNOSIS — Z9889 Other specified postprocedural states: Secondary | ICD-10-CM | POA: Diagnosis not present

## 2022-05-14 DIAGNOSIS — Z0000 Encounter for general adult medical examination without abnormal findings: Secondary | ICD-10-CM | POA: Diagnosis not present

## 2022-05-14 DIAGNOSIS — Z8679 Personal history of other diseases of the circulatory system: Secondary | ICD-10-CM | POA: Diagnosis not present

## 2022-05-14 DIAGNOSIS — E785 Hyperlipidemia, unspecified: Secondary | ICD-10-CM | POA: Diagnosis not present

## 2022-05-14 DIAGNOSIS — R7303 Prediabetes: Secondary | ICD-10-CM | POA: Diagnosis not present

## 2022-05-19 ENCOUNTER — Ambulatory Visit: Payer: Medicare Other | Admitting: Podiatry

## 2022-05-19 DIAGNOSIS — L84 Corns and callosities: Secondary | ICD-10-CM

## 2022-05-19 NOTE — Progress Notes (Signed)
  Subjective:  Patient ID: Jason Schmidt, male    DOB: 27-Feb-1955,  MRN: [id number]  Chief Complaint  Patient presents with   Callouses    Patient came in today for right foot 5th toe callus trim     67 y.o. male presents with concern for painful corn on the side of the right fifth toe.  He says he does not wearing a long time he has tried some lotion in the past but is not using any right now.  Says it would not go away and causes him significant pain with any pressure on the area when walking.  Past Medical History:  Diagnosis Date   Carotid artery occlusion    Carotid bruit    GERD (gastroesophageal reflux disease)    Rectal polyp    Thyroid disease     Allergies  Allergen Reactions   Codeine     ROS: Negative except as per HPI above  Objective:  General: AAO x3, NAD  Dermatological: Hyperkeratotic lesion at the plantar lateral aspect of the right fifth toe distal phalanx.  No underlying ulceration but significant pain with pressure on this area.  Vascular:  Dorsalis Pedis artery and Posterior Tibial artery pedal pulses are 2/4 bilateral.  Capillary fill time < 3 sec to all digits.   Neruologic: Grossly intact via light touch bilateral. Protective threshold intact to all sites bilateral.   Musculoskeletal: Adductovarus rotation of the toe on the right foot.  Pain on palpation of the  Gait: Unassisted, Nonantalgic.   No images are attached to the encounter.  Radiographs:  Deferred Assessment:   1. Corn of toe       Plan:  Patient was evaluated and treated and all questions answered.  #Painful hyperkeratotic lesion pressure corn at the plantar lateral aspect of the right 5th toe distal phalanx # Adductovarus rotation fifth toe All symptomatic hyperkeratoses were safely debrided with a sterile #15 blade to patient's level of comfort without incident. We discussed preventative and palliative care of these lesions including supportive and accommodative shoegear,  padding, prefabricated and custom molded accommodative orthoses, use of a pumice stone and lotions/creams daily. -Also recommend gel toe cap for the right fifth toe   Return in about 3 months (around 08/18/2022) for RFC callus.          Corinna Gab, DPM [facility] / [facility]

## 2022-07-16 DIAGNOSIS — M545 Low back pain, unspecified: Secondary | ICD-10-CM | POA: Diagnosis not present

## 2022-08-18 ENCOUNTER — Ambulatory Visit: Payer: Medicare Other | Admitting: Podiatry

## 2022-08-24 ENCOUNTER — Ambulatory Visit: Payer: Medicare Other | Admitting: Podiatry

## 2022-08-24 DIAGNOSIS — L84 Corns and callosities: Secondary | ICD-10-CM | POA: Diagnosis not present

## 2022-08-24 DIAGNOSIS — M205X1 Other deformities of toe(s) (acquired), right foot: Secondary | ICD-10-CM

## 2022-08-24 NOTE — Progress Notes (Signed)
  Subjective:  Patient ID: Jason Schmidt, male    DOB: [birth year],  MRN: [id number]  Chief Complaint  Patient presents with   Callouses    Corn trim to right 5th toe.     67 y.o. male presents with concern for painful corn on the side of the right fifth toe.  He says he does not wearing a long time he has tried some lotion in the past but is not using any right now.  Says it would not go away and causes him significant pain with any pressure on the area when walking.  Past Medical History:  Diagnosis Date   Carotid artery occlusion    Carotid bruit    GERD (gastroesophageal reflux disease)    Rectal polyp    Thyroid disease     Allergies  Allergen Reactions   Codeine     ROS: Negative except as per HPI above  Objective:  General: AAO x3, NAD  Dermatological: Hyperkeratotic lesion at the plantar lateral aspect of the right fifth toe distal phalanx.  No underlying ulceration but significant pain with pressure on this area.  Vascular:  Dorsalis Pedis artery and Posterior Tibial artery pedal pulses are 2/4 bilateral.  Capillary fill time < 3 sec to all digits.   Neruologic: Grossly intact via light touch bilateral. Protective threshold intact to all sites bilateral.   Musculoskeletal: Adductovarus rotation of the toe on the right foot.  Pain on palpation of the  Gait: Unassisted, Nonantalgic.   No images are attached to the encounter.  Radiographs:  Deferred Assessment:   1. Corn of toe   2. Adductovarus rotation of toe, acquired, right        Plan:  Patient was evaluated and treated and all questions answered.  #Painful hyperkeratotic lesion pressure corn at the plantar lateral aspect of the right 5th toe distal phalanx # Adductovarus rotation fifth toe All symptomatic hyperkeratoses were safely debrided with a sterile #15 blade to patient's level of comfort without incident. We discussed preventative and palliative care of these lesions including supportive and  accommodative shoegear, padding, prefabricated and custom molded accommodative orthoses, use of a pumice stone and lotions/creams daily. -Also recommend gel toe cap for the right fifth toe -Discussed derotational arthroplasty of the fifth toe.  Patient will think about it for future consideration   Return if symptoms worsen or fail to improve.          Corinna Gab, DPM [facility] / [facility]

## 2022-09-21 DIAGNOSIS — K649 Unspecified hemorrhoids: Secondary | ICD-10-CM | POA: Diagnosis not present

## 2022-09-21 DIAGNOSIS — R1310 Dysphagia, unspecified: Secondary | ICD-10-CM | POA: Diagnosis not present

## 2022-11-13 DIAGNOSIS — E785 Hyperlipidemia, unspecified: Secondary | ICD-10-CM | POA: Diagnosis not present

## 2022-11-13 DIAGNOSIS — R195 Other fecal abnormalities: Secondary | ICD-10-CM | POA: Diagnosis not present

## 2022-11-13 DIAGNOSIS — R7303 Prediabetes: Secondary | ICD-10-CM | POA: Diagnosis not present

## 2022-11-13 DIAGNOSIS — G4700 Insomnia, unspecified: Secondary | ICD-10-CM | POA: Diagnosis not present

## 2022-11-13 DIAGNOSIS — E039 Hypothyroidism, unspecified: Secondary | ICD-10-CM | POA: Diagnosis not present

## 2023-03-02 IMAGING — CT CT ANGIO HEAD
2 of 5 series · 9 of 37 positions shown · non-contrast
Comparison: Head CT July 21, 2020.

CLINICAL DATA: Acute intractable headache, unspecified headache
type KFR.X (UIL-XS-CM). Cerebral aneurysm KB9.Z (UIL-XS-CM).

EXAM:
CT ANGIOGRAPHY HEAD
TECHNIQUE: Multidetector CT imaging of the head was performed using the
standard protocol during bolus administration of intravenous
contrast. Multiplanar CT image reconstructions and MIPs were
obtained to evaluate the vascular anatomy.

[Series 7: cow 2.0 · axial · 0.42mm/px · z∈[+1480,+1602]mm · 6 of 87 slices shown]
[im 13/87  brain]
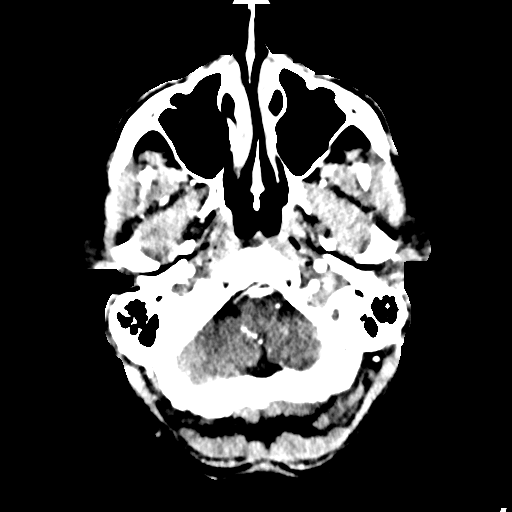
[im 25/87  bone]
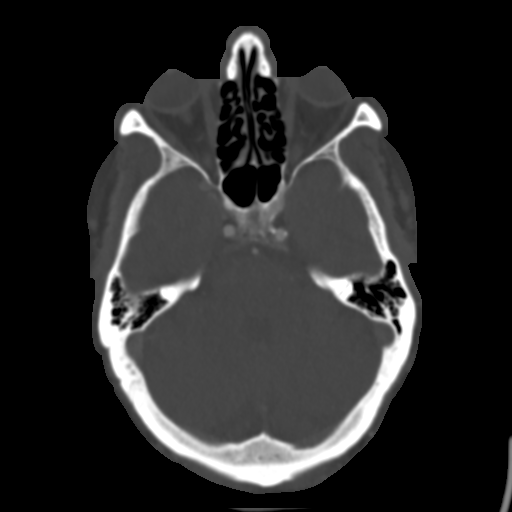
[im 37/87  brain]
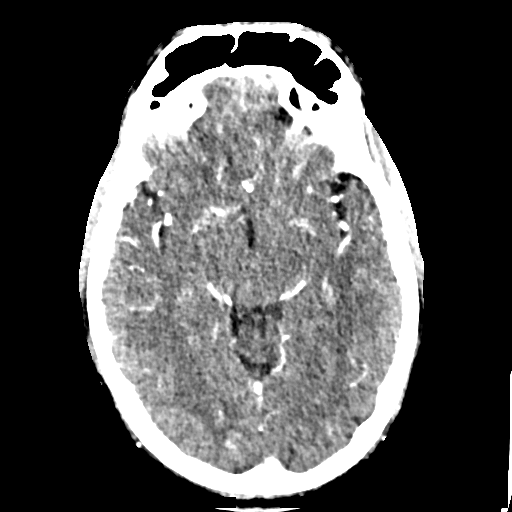
[im 50/87  bone]
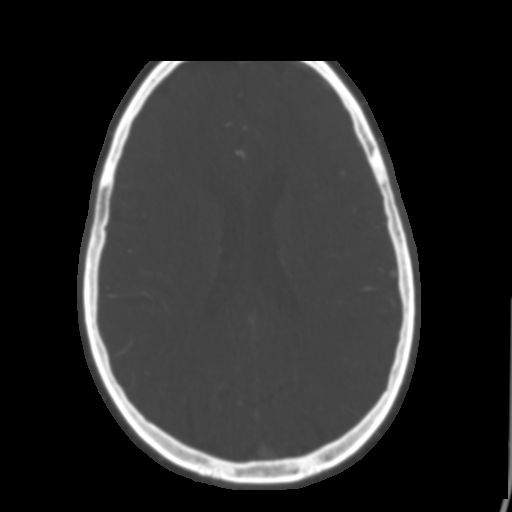
[im 62/87  brain]
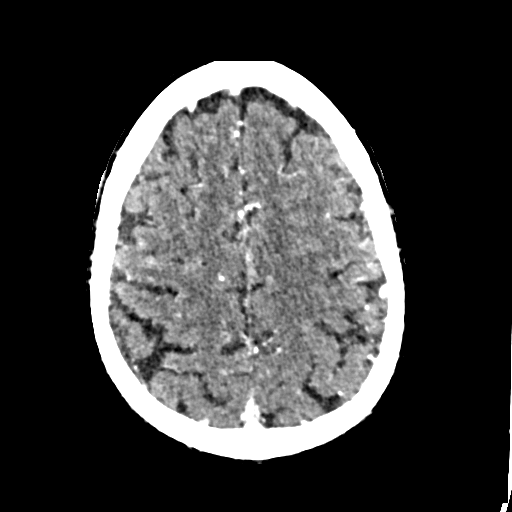
[im 74/87  bone]
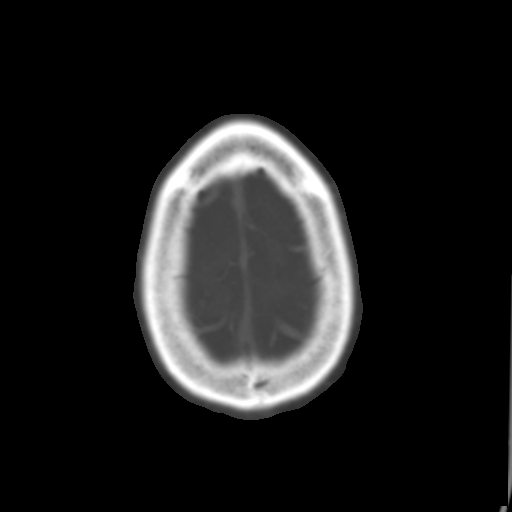

[Series 10: sagittal · sagittal · 0.37mm/px · 3 of 62 slices shown]
[im 21/62  brain]
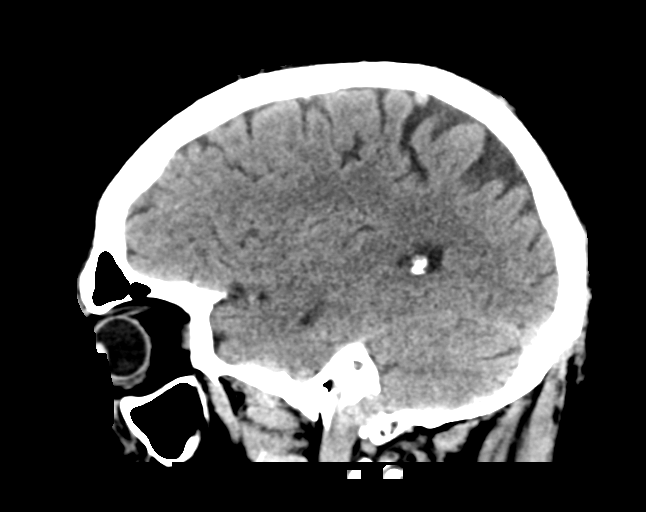
[im 31/62  brain]
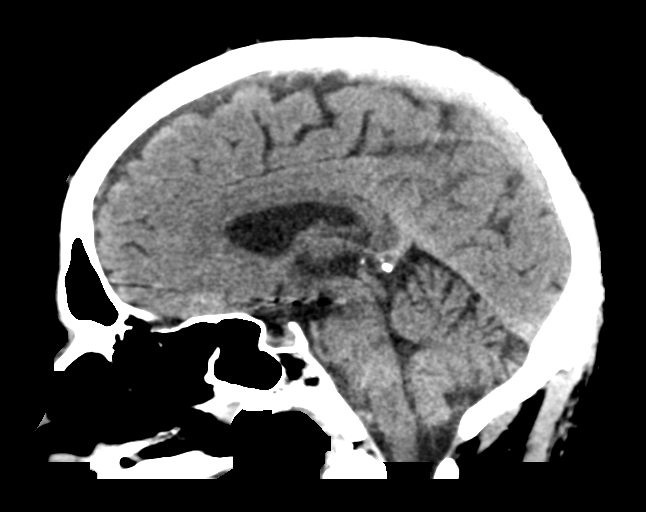
[im 41/62  brain]
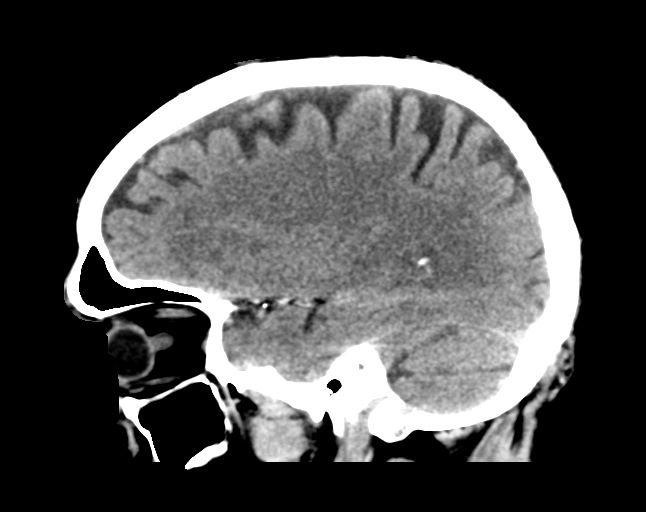

[9 of 37 positions shown; findings below may reference images not displayed]

RADIATION DOSE REDUCTION: This exam was performed according to the
departmental dose-optimization program which includes automated
exposure control, adjustment of the mA and/or kV according to
patient size and/or use of iterative reconstruction technique.

CONTRAST:  75mL [id number] IOPAMIDOL ([id number]) INJECTION 76%
FINDINGS: CT HEAD

Brain: No evidence of acute infarction, hemorrhage, hydrocephalus,
extra-axial collection or mass lesion/mass effect. Streak artifact
from prior stent assisted coiling of a right anterior choroidal
artery aneurysm partially obscure images at the level of the basal
cisterns.

Vascular: Calcified plaques in the bilateral carotid siphons. Right
ICA-MCA stent.

Skull: Normal. Negative for fracture or focal lesion.

Sinuses: No acute or significant finding.

Other: None.

CTA HEAD

Anterior circulation: Calcified plaques are seen in the bilateral
carotid siphons feel hemodynamically significant stenosis. Stent
assisted coiling of right anterior choroidal artery aneurysm is seen
with stent spanning the paraclinoid ICA 2 the distal right M1/MCA.
No residual aneurysm filling is seen, however, small residual
aneurysm could be obscured by streak artifact from coil mass. A
mm outpouching from the undersurface of the left ICA most likely
represents a small infundibulum at the origin of the left posterior
communicating artery. The bilateral MCA and ACA vascular trees are
maintained.

Posterior circulation: Dominant right vertebral artery with normal
course and caliber from the V3 segment to the vertebrobasilar
junction. Hypoplastic left vertebral artery is unremarkable. The
basilar artery and bilateral posterior cerebral arteries are
maintained noting origin of the right PCA directly from the right
ICA (fetal PCA).

Venous sinuses: As permitted by contrast timing, patent.

Anatomic variants: Fetal right PCA.

Review of the MIP images confirms the above findings.
IMPRESSION: 1. No acute intracranial abnormality.
2. Status post stent assisted coiling of a right anterior choroidal
artery aneurysm without evidence of complication.

## 2023-04-13 DIAGNOSIS — Z0000 Encounter for general adult medical examination without abnormal findings: Secondary | ICD-10-CM | POA: Diagnosis not present

## 2023-04-13 DIAGNOSIS — Z9181 History of falling: Secondary | ICD-10-CM | POA: Diagnosis not present

## 2023-05-14 DIAGNOSIS — E785 Hyperlipidemia, unspecified: Secondary | ICD-10-CM | POA: Diagnosis not present

## 2023-05-14 DIAGNOSIS — Z9889 Other specified postprocedural states: Secondary | ICD-10-CM | POA: Diagnosis not present

## 2023-05-14 DIAGNOSIS — E039 Hypothyroidism, unspecified: Secondary | ICD-10-CM | POA: Diagnosis not present

## 2023-05-14 DIAGNOSIS — Z8679 Personal history of other diseases of the circulatory system: Secondary | ICD-10-CM | POA: Diagnosis not present

## 2023-05-14 DIAGNOSIS — R030 Elevated blood-pressure reading, without diagnosis of hypertension: Secondary | ICD-10-CM | POA: Diagnosis not present

## 2023-05-14 DIAGNOSIS — G4700 Insomnia, unspecified: Secondary | ICD-10-CM | POA: Diagnosis not present

## 2023-05-14 DIAGNOSIS — R7303 Prediabetes: Secondary | ICD-10-CM | POA: Diagnosis not present

## 2023-05-14 DIAGNOSIS — J302 Other seasonal allergic rhinitis: Secondary | ICD-10-CM | POA: Diagnosis not present

## 2023-09-06 DIAGNOSIS — M5441 Lumbago with sciatica, right side: Secondary | ICD-10-CM | POA: Diagnosis not present

## 2023-09-27 DIAGNOSIS — M5416 Radiculopathy, lumbar region: Secondary | ICD-10-CM | POA: Diagnosis not present

## 2023-09-27 DIAGNOSIS — S3992XD Unspecified injury of lower back, subsequent encounter: Secondary | ICD-10-CM | POA: Diagnosis not present

## 2023-10-06 DIAGNOSIS — S3992XD Unspecified injury of lower back, subsequent encounter: Secondary | ICD-10-CM | POA: Diagnosis not present

## 2023-10-06 DIAGNOSIS — M5416 Radiculopathy, lumbar region: Secondary | ICD-10-CM | POA: Diagnosis not present

## 2023-10-13 DIAGNOSIS — M5416 Radiculopathy, lumbar region: Secondary | ICD-10-CM | POA: Diagnosis not present

## 2023-10-13 DIAGNOSIS — S3992XD Unspecified injury of lower back, subsequent encounter: Secondary | ICD-10-CM | POA: Diagnosis not present

## 2023-10-20 DIAGNOSIS — M5416 Radiculopathy, lumbar region: Secondary | ICD-10-CM | POA: Diagnosis not present

## 2023-10-20 DIAGNOSIS — S3992XD Unspecified injury of lower back, subsequent encounter: Secondary | ICD-10-CM | POA: Diagnosis not present

## 2023-10-27 DIAGNOSIS — M5416 Radiculopathy, lumbar region: Secondary | ICD-10-CM | POA: Diagnosis not present

## 2023-10-27 DIAGNOSIS — S3992XD Unspecified injury of lower back, subsequent encounter: Secondary | ICD-10-CM | POA: Diagnosis not present

## 2023-11-04 DIAGNOSIS — S3992XD Unspecified injury of lower back, subsequent encounter: Secondary | ICD-10-CM | POA: Diagnosis not present

## 2023-11-04 DIAGNOSIS — M5416 Radiculopathy, lumbar region: Secondary | ICD-10-CM | POA: Diagnosis not present

## 2023-11-09 DIAGNOSIS — M5416 Radiculopathy, lumbar region: Secondary | ICD-10-CM | POA: Diagnosis not present

## 2023-11-09 DIAGNOSIS — S3992XD Unspecified injury of lower back, subsequent encounter: Secondary | ICD-10-CM | POA: Diagnosis not present

## 2023-11-16 ENCOUNTER — Other Ambulatory Visit (HOSPITAL_BASED_OUTPATIENT_CLINIC_OR_DEPARTMENT_OTHER): Payer: Self-pay | Admitting: Internal Medicine

## 2023-11-16 ENCOUNTER — Ambulatory Visit (INDEPENDENT_AMBULATORY_CARE_PROVIDER_SITE_OTHER)
Admission: RE | Admit: 2023-11-16 | Discharge: 2023-11-16 | Disposition: A | Discharge status: Home / Self Care | Source: Ambulatory Visit | Attending: Internal Medicine | Admitting: Internal Medicine

## 2023-11-16 DIAGNOSIS — Z8679 Personal history of other diseases of the circulatory system: Secondary | ICD-10-CM | POA: Diagnosis not present

## 2023-11-16 DIAGNOSIS — J302 Other seasonal allergic rhinitis: Secondary | ICD-10-CM | POA: Diagnosis not present

## 2023-11-16 DIAGNOSIS — R1310 Dysphagia, unspecified: Secondary | ICD-10-CM | POA: Diagnosis not present

## 2023-11-16 DIAGNOSIS — R059 Cough, unspecified: Secondary | ICD-10-CM

## 2023-11-16 DIAGNOSIS — R0781 Pleurodynia: Secondary | ICD-10-CM | POA: Diagnosis not present

## 2023-11-16 DIAGNOSIS — E039 Hypothyroidism, unspecified: Secondary | ICD-10-CM | POA: Diagnosis not present

## 2023-11-16 DIAGNOSIS — R0789 Other chest pain: Secondary | ICD-10-CM

## 2023-11-16 DIAGNOSIS — R7303 Prediabetes: Secondary | ICD-10-CM | POA: Diagnosis not present

## 2023-11-16 DIAGNOSIS — E785 Hyperlipidemia, unspecified: Secondary | ICD-10-CM | POA: Diagnosis not present

## 2023-11-16 DIAGNOSIS — Z2821 Immunization not carried out because of patient refusal: Secondary | ICD-10-CM | POA: Diagnosis not present

## 2023-11-16 DIAGNOSIS — G4700 Insomnia, unspecified: Secondary | ICD-10-CM | POA: Diagnosis not present

## 2023-11-17 DIAGNOSIS — S3992XD Unspecified injury of lower back, subsequent encounter: Secondary | ICD-10-CM | POA: Diagnosis not present

## 2023-11-17 DIAGNOSIS — M5416 Radiculopathy, lumbar region: Secondary | ICD-10-CM | POA: Diagnosis not present

## 2023-11-23 DIAGNOSIS — S3992XD Unspecified injury of lower back, subsequent encounter: Secondary | ICD-10-CM | POA: Diagnosis not present

## 2023-11-23 DIAGNOSIS — M5416 Radiculopathy, lumbar region: Secondary | ICD-10-CM | POA: Diagnosis not present

## 2023-11-25 DIAGNOSIS — S3992XD Unspecified injury of lower back, subsequent encounter: Secondary | ICD-10-CM | POA: Diagnosis not present

## 2023-11-25 DIAGNOSIS — M5416 Radiculopathy, lumbar region: Secondary | ICD-10-CM | POA: Diagnosis not present

## 2023-11-30 DIAGNOSIS — M5416 Radiculopathy, lumbar region: Secondary | ICD-10-CM | POA: Diagnosis not present

## 2023-11-30 DIAGNOSIS — S3992XD Unspecified injury of lower back, subsequent encounter: Secondary | ICD-10-CM | POA: Diagnosis not present

## 2023-12-06 ENCOUNTER — Encounter (HOSPITAL_BASED_OUTPATIENT_CLINIC_OR_DEPARTMENT_OTHER): Payer: Self-pay

## 2023-12-06 ENCOUNTER — Ambulatory Visit (HOSPITAL_BASED_OUTPATIENT_CLINIC_OR_DEPARTMENT_OTHER): Admission: RE | Admit: 2023-12-06 | Discharge: 2023-12-06 | Disposition: A | Discharge status: Home / Self Care

## 2023-12-06 VITALS — BP 164/72 | HR 61 | Temp 97.7°F | Resp 20

## 2023-12-06 DIAGNOSIS — R051 Acute cough: Secondary | ICD-10-CM

## 2023-12-06 MED ORDER — PREDNISONE 20 MG PO TABS: 40.0000 mg | ORAL_TABLET | Freq: Every day | ORAL | 0 refills | Status: AC

## 2023-12-06 NOTE — ED Provider Notes (Signed)
 [facility]    CSN: [id number] Arrival date & time: 12/06/23  0810      History   Chief Complaint Chief Complaint  Patient presents with   Cough   sinus drainage    HPI Jason Schmidt is a 68 y.o. male.   68 year old male that presents with upper respiratory symptoms. Onset Friday night of scratchy throat. States cough developed following. Reports thinks scratchy throat is due to post nasal drip. Denies fever, denies SOB, denies ear pain. Sudafed and tylenol taken this morning.     Cough   Past Medical History:  Diagnosis Date   Carotid artery occlusion    Carotid bruit    GERD (gastroesophageal reflux disease)    Rectal polyp    Thyroid  disease     Patient Active Problem List   Diagnosis Date Noted   Hypothyroidism 10/28/2018   Carotid bruit 10/28/2018   GERD (gastroesophageal reflux disease) 10/28/2018    Past Surgical History:  Procedure Laterality Date   i&d abcess and hemorrhoidectomy     NECK SURGERY  2017   Metal in neck at work   SHOULDER SURGERY Left 2018   Nerve damage   TRANSANAL EXCISION OF RECTAL MASS         Home Medications    Prior to Admission medications   Medication Sig Start Date End Date Taking? Authorizing Provider  predniSONE (DELTASONE) 20 MG tablet Take 2 tablets (40 mg total) by mouth daily with breakfast for 5 days. 12/06/23 12/11/23 Yes Kiefer Opheim A, FNP  gabapentin (NEURONTIN) 300 MG capsule Take 300 mg by mouth 3 (three) times daily.    [provider]  levothyroxine (SYNTHROID) 100 MCG tablet Take 100 mcg by mouth daily before breakfast.    [provider]  urea  (CARMOL) 10 % cream Apply topically as needed. 03/10/22   Standiford, Marsa FALCON, DPM    Family History Family History  Problem Relation Age of Onset   Hypertension Mother    Hyperlipidemia Mother    Heart attack Mother    Heart disease Father    Pancreatic cancer Brother    Heart disease Brother     Social History Social  History   Tobacco Use   Smoking status: Former    Current packs/day: 0.00    Types: Cigarettes    Quit date: 2007    Years since quitting: 18.8   Smokeless tobacco: Never  Vaping Use   Vaping status: Never Used  Substance Use Topics   Alcohol use: Not Currently   Drug use: Never     Allergies   Codeine and Hydrocodone-acetaminophen   Review of Systems Review of Systems  Respiratory:  Positive for cough.      Physical Exam Triage Vital Signs ED Triage Vitals  Encounter Vitals Group     BP 12/06/23 0822 (!) 164/72     Girls Systolic BP Percentile --      Girls Diastolic BP Percentile --      Boys Systolic BP Percentile --      Boys Diastolic BP Percentile --      Pulse Rate 12/06/23 0822 61     Resp 12/06/23 0822 20     Temp 12/06/23 0822 97.7 F (36.5 C)     Temp src --      SpO2 12/06/23 0822 95 %     Weight --      Height --      Head Circumference --  Peak Flow --      Pain Score 12/06/23 0824 7     Pain Loc --      Pain Education --      Exclude from Growth Chart --    No data found.  Updated Vital Signs BP (!) 164/72 (BP Location: Right Arm)   Pulse 61   Temp 97.7 F (36.5 C)   Resp 20   SpO2 95%   Visual Acuity Right Eye Distance:   Left Eye Distance:   Bilateral Distance:    Right Eye Near:   Left Eye Near:    Bilateral Near:     Physical Exam Constitutional:      General: He is not in acute distress.    Appearance: Normal appearance. He is not ill-appearing, toxic-appearing or diaphoretic.  HENT:     Right Ear: Tympanic membrane, ear canal and external ear normal.     Left Ear: Tympanic membrane, ear canal and external ear normal.     Nose: Congestion and rhinorrhea present.     Mouth/Throat:     Pharynx: Oropharynx is clear.  Eyes:     Conjunctiva/sclera: Conjunctivae normal.  Cardiovascular:     Rate and Rhythm: Normal rate and regular rhythm.     Pulses: Normal pulses.     Heart sounds: Normal heart sounds.   Pulmonary:     Effort: Pulmonary effort is normal.     Breath sounds: Normal breath sounds.  Musculoskeletal:        General: Normal range of motion.  Skin:    General: Skin is warm and dry.  Neurological:     Mental Status: He is alert.  Psychiatric:        Mood and Affect: Mood normal.      UC Treatments / Results  Labs (all labs ordered are listed, but only abnormal results are displayed) Labs Reviewed - No data to display  EKG   Radiology No results found.  Procedures Procedures (including critical care time)  Medications Ordered in UC Medications - No data to display  Initial Impression / Assessment and Plan / UC Course  I have reviewed the triage vital signs and the nursing notes.  Pertinent labs & imaging results that were available during my care of the patient were reviewed by me and considered in my medical decision making (see chart for details).     Cough-most likely allergy related.  Recommend Zyrtec daily.  Also recommended Flonase nasal spray.  Start prednisone as prescribed daily with food in the morning. Can continue other over-the-counter medicines if needed.  Follow-up as needed Final Clinical Impressions(s) / UC Diagnoses   Final diagnoses:  Acute cough     Discharge Instructions      Take the prednisone as prescribed daily with food preferably in the morning.  Start on Zyrtec daily.  You can do Flonase nasal spray over-the-counter also for nasal congestion. Follow-up as needed with your regular doctor     ED Prescriptions     Medication Sig Dispense Auth. Provider   predniSONE (DELTASONE) 20 MG tablet Take 2 tablets (40 mg total) by mouth daily with breakfast for 5 days. 10 tablet Adah Wilbert LABOR, FNP      PDMP not reviewed this encounter.   Adah Wilbert LABOR, FNP 12/06/23 [phone]

## 2023-12-06 NOTE — Discharge Instructions (Signed)
 Take the prednisone as prescribed daily with food preferably in the morning.  Start on Zyrtec daily.  You can do Flonase nasal spray over-the-counter also for nasal congestion. Follow-up as needed with your regular doctor

## 2023-12-06 NOTE — ED Triage Notes (Signed)
 Onset Friday night of scratchy throat. States cough developed following. Reports thinks scratchy throat is due to post nasal drip. Denies fever, denies SOB, denies ear pain. Sudafed and tylenol taken this morning.
